# Patient Record
Sex: Male | Born: 1970 | Race: Black or African American | Hispanic: No | Marital: Single | Smoking: Never smoker
Health system: Southern US, Community
[De-identification: ages and names within clinical notes are randomized; demographics above are authoritative.]

## PROBLEM LIST (undated history)

## (undated) DIAGNOSIS — J45909 Unspecified asthma, uncomplicated: Secondary | ICD-10-CM

## (undated) DIAGNOSIS — I10 Essential (primary) hypertension: Secondary | ICD-10-CM

---

## 2013-01-31 ENCOUNTER — Emergency Department (HOSPITAL_BASED_OUTPATIENT_CLINIC_OR_DEPARTMENT_OTHER)
Admission: EM | Admit: 2013-01-31 | Discharge: 2013-01-31 | Disposition: A | Discharge status: Home / Self Care | Payer: Self-pay | Attending: Emergency Medicine | Admitting: Emergency Medicine

## 2013-01-31 ENCOUNTER — Encounter (HOSPITAL_BASED_OUTPATIENT_CLINIC_OR_DEPARTMENT_OTHER): Payer: Self-pay | Admitting: Emergency Medicine

## 2013-01-31 DIAGNOSIS — Y929 Unspecified place or not applicable: Secondary | ICD-10-CM | POA: Insufficient documentation

## 2013-01-31 DIAGNOSIS — Z880 Allergy status to penicillin: Secondary | ICD-10-CM | POA: Insufficient documentation

## 2013-01-31 DIAGNOSIS — T6391XA Toxic effect of contact with unspecified venomous animal, accidental (unintentional), initial encounter: Secondary | ICD-10-CM | POA: Insufficient documentation

## 2013-01-31 DIAGNOSIS — Y939 Activity, unspecified: Secondary | ICD-10-CM | POA: Insufficient documentation

## 2013-01-31 DIAGNOSIS — T63481A Toxic effect of venom of other arthropod, accidental (unintentional), initial encounter: Secondary | ICD-10-CM | POA: Insufficient documentation

## 2013-01-31 DIAGNOSIS — Z23 Encounter for immunization: Secondary | ICD-10-CM | POA: Insufficient documentation

## 2013-01-31 MED ORDER — TETANUS-DIPHTH-ACELL PERTUSSIS 5-2.5-18.5 LF-MCG/0.5 IM SUSP
0.5000 mL | Freq: Once | INTRAMUSCULAR | Status: AC
  Administered 2013-01-31: 0.5 mL via INTRAMUSCULAR
  Filled 2013-01-31: qty 0.5

## 2013-01-31 NOTE — ED Notes (Signed)
Insect bite to top of left foot yesterday.  Pt foot is red and swollen.

## 2013-01-31 NOTE — ED Provider Notes (Signed)
History     CSN: [id number]  Arrival date & time 01/31/13  2107   First MD Initiated Contact with Patient 01/31/13 2130      Chief Complaint  Patient presents with  . Insect Bite    (Consider location/radiation/quality/duration/timing/severity/associated sxs/prior treatment) HPI Comments: Patient comes to the ER for evaluation of pain after possible insect bite resting. Patient reports that he was stung on the top of the left foot yesterday. He has had some residual pain in the area. He has not had any tongue swelling, throat swelling or difficulty breathing. No rash.   No past medical history on file.  No past surgical history on file.  No family history on file.  History  Substance Use Topics  . Smoking status: Not on file  . Smokeless tobacco: Not on file  . Alcohol Use: Not on file      Review of Systems  HENT: Negative.   Skin: Negative for rash.  All other systems reviewed and are negative.    Allergies  Penicillins  Home Medications  No current outpatient prescriptions on file.  BP 164/100  Pulse 108  Temp(Src) 98.2 F (36.8 C) (Oral)  Resp 20  Ht 6' (1.829 m)  Wt 362 lb (164.202 kg)  BMI 49.09 kg/m2  SpO2 98%  Physical Exam  Constitutional: He is oriented to person, place, and time. He appears well-developed and well-nourished. No distress.  HENT:  Head: Normocephalic and atraumatic.  Right Ear: Hearing normal.  Left Ear: Hearing normal.  Nose: Nose normal.  Mouth/Throat: Oropharynx is clear and moist and mucous membranes are normal.  Eyes: Conjunctivae and EOM are normal. Pupils are equal, round, and reactive to light.  Neck: Normal range of motion. Neck supple.  Cardiovascular: Regular rhythm, S1 normal and S2 normal.  Exam reveals no gallop and no friction rub.   No murmur heard. Pulmonary/Chest: Effort normal and breath sounds normal. No respiratory distress. He exhibits no tenderness.  Abdominal: Soft. Normal appearance and bowel  sounds are normal. There is no hepatosplenomegaly. There is no tenderness. There is no rebound, no guarding, no tenderness at McBurney's point and negative Murphy's sign. No hernia.  Musculoskeletal: Normal range of motion.  Neurological: He is alert and oriented to person, place, and time. He has normal strength. No cranial nerve deficit or sensory deficit. Coordination normal. GCS eye subscore is 4. GCS verbal subscore is 5. GCS motor subscore is 6.  Skin: Skin is warm, dry and intact. No rash noted. No cyanosis.     Psychiatric: He has a normal mood and affect. His speech is normal and behavior is normal. Thought content normal.    ED Course  Procedures (including critical care time)  Labs Reviewed - No data to display No results found.   Diagnosis: Insect bite/sting   MDM  Patient was stung on the foot yesterday and has continued pain today. There is very minimal redness, does not appear to be infected at this time. Likely local reaction. Patient to ice, elevate and take Benadryl. Return if he develops fever increasing redness.        Gilda Crease, MD 01/31/13 [phone]

## 2013-04-29 ENCOUNTER — Encounter (HOSPITAL_BASED_OUTPATIENT_CLINIC_OR_DEPARTMENT_OTHER): Payer: Self-pay

## 2013-04-29 ENCOUNTER — Emergency Department (HOSPITAL_BASED_OUTPATIENT_CLINIC_OR_DEPARTMENT_OTHER)
Admission: EM | Admit: 2013-04-29 | Discharge: 2013-04-29 | Disposition: A | Discharge status: Home / Self Care | Payer: Self-pay | Attending: Emergency Medicine | Admitting: Emergency Medicine

## 2013-04-29 DIAGNOSIS — L0390 Cellulitis, unspecified: Secondary | ICD-10-CM

## 2013-04-29 DIAGNOSIS — Y929 Unspecified place or not applicable: Secondary | ICD-10-CM | POA: Insufficient documentation

## 2013-04-29 DIAGNOSIS — R21 Rash and other nonspecific skin eruption: Secondary | ICD-10-CM | POA: Insufficient documentation

## 2013-04-29 DIAGNOSIS — L02419 Cutaneous abscess of limb, unspecified: Secondary | ICD-10-CM | POA: Insufficient documentation

## 2013-04-29 DIAGNOSIS — L089 Local infection of the skin and subcutaneous tissue, unspecified: Secondary | ICD-10-CM | POA: Insufficient documentation

## 2013-04-29 DIAGNOSIS — Z880 Allergy status to penicillin: Secondary | ICD-10-CM | POA: Insufficient documentation

## 2013-04-29 DIAGNOSIS — W57XXXA Bitten or stung by nonvenomous insect and other nonvenomous arthropods, initial encounter: Secondary | ICD-10-CM | POA: Insufficient documentation

## 2013-04-29 DIAGNOSIS — L02519 Cutaneous abscess of unspecified hand: Secondary | ICD-10-CM | POA: Insufficient documentation

## 2013-04-29 DIAGNOSIS — Y9389 Activity, other specified: Secondary | ICD-10-CM | POA: Insufficient documentation

## 2013-04-29 DIAGNOSIS — S30860A Insect bite (nonvenomous) of lower back and pelvis, initial encounter: Secondary | ICD-10-CM | POA: Insufficient documentation

## 2013-04-29 MED ORDER — CLINDAMYCIN HCL 150 MG PO CAPS: 450.0000 mg | ORAL_CAPSULE | Freq: Three times a day (TID) | ORAL | Status: DC

## 2013-04-29 MED ORDER — DIPHENHYDRAMINE HCL 50 MG PO CAPS: 50.0000 mg | ORAL_CAPSULE | Freq: Four times a day (QID) | ORAL | Status: DC | PRN

## 2013-04-29 MED ORDER — HYDROCODONE-ACETAMINOPHEN 5-325 MG PO TABS
2.0000 | ORAL_TABLET | Freq: Once | ORAL | Status: AC
  Administered 2013-04-29: 2 via ORAL
  Filled 2013-04-29: qty 2

## 2013-04-29 MED ORDER — DIPHENHYDRAMINE HCL 25 MG PO CAPS
50.0000 mg | ORAL_CAPSULE | Freq: Once | ORAL | Status: AC
  Administered 2013-04-29: 50 mg via ORAL
  Filled 2013-04-29: qty 2

## 2013-04-29 MED ORDER — CLINDAMYCIN HCL 150 MG PO CAPS
450.0000 mg | ORAL_CAPSULE | Freq: Once | ORAL | Status: AC
  Administered 2013-04-29: 450 mg via ORAL
  Filled 2013-04-29: qty 3

## 2013-04-29 NOTE — ED Provider Notes (Signed)
CSN: [id number]     Arrival date & time 04/29/13  1442 History  This chart was scribed for Brad Hait, MD by Joaquin Music, ED Scribe. This patient was seen in room MH05/MH05 and the patient's care was started at 3:35 PM    Chief Complaint  Patient presents with  . Insect Bite   Patient is a 42 y.o. male presenting with rash. The history is provided by the patient.  Rash Location:  Hand, foot and torso Hand rash location:  L wrist Foot rash location:  L ankle Quality: itchiness and painful   Context: insect bite/sting   Relieved by:  Antihistamines Associated symptoms: no diarrhea, no fever, no nausea, no shortness of breath and not vomiting    HPI Comments: Brad Griffith is a 42 y.o. male who presents to the Emergency Department complaining of constant unchanged pain and itching to the left hand, abdomen, and ankle that began yesterday. Pt believes he was bitten by fire ants to the ankle and other insects to other areas of the body. Pt has tried Benadryl with mild relief. Pt denies nausea, emesis, diarrhea, SOB, chest pain, fever .    History reviewed. No pertinent past medical history. History reviewed. No pertinent past surgical history. No family history on file. History  Substance Use Topics  . Smoking status: Never Smoker   . Smokeless tobacco: Not on file  . Alcohol Use: No    Review of Systems  Constitutional: Negative for fever and chills.  Respiratory: Negative for shortness of breath.   Cardiovascular: Negative for chest pain and leg swelling.  Gastrointestinal: Negative for nausea, vomiting and diarrhea.  Skin: Positive for rash.  All other systems reviewed and are negative.    Allergies  Penicillins  Home Medications  No current outpatient prescriptions on file. BP 155/81  Pulse 92  Temp(Src) 98.7 F (37.1 C) (Oral)  Resp 21  Ht 6\' 1"  (1.854 m)  Wt 378 lb (171.46 kg)  BMI 49.88 kg/m2  SpO2 99%  Physical Exam  Nursing note  and vitals reviewed. Constitutional: He is oriented to person, place, and time. He appears well-developed and well-nourished. No distress.  HENT:  Head: Normocephalic and atraumatic.  Eyes: EOM are normal.  Neck: Neck supple. No tracheal deviation present.  Cardiovascular: Normal rate.   Pulmonary/Chest: Effort normal. No respiratory distress.  Musculoskeletal: Normal range of motion.       Left wrist: He exhibits swelling (mild). He exhibits normal range of motion, no tenderness and no bony tenderness.       Left hand: He exhibits swelling (mild, diffuse).  Normal cap refill and ROM of L hand/wrist/fingers. Mild swelling, no redness or erythema. No cellulitis or abscess identified.   Neurological: He is alert and oriented to person, place, and time.  Skin: Skin is warm and dry.  Has small pustules around the left ankle and the inside of the left wrist. 2 lesions on left lower abdomen with mild surrounding cellulitis.  small red blanchable areas on the  right abdomen  Psychiatric: He has a normal mood and affect. His behavior is normal.    ED Course  Procedures  DIAGNOSTIC STUDIES: Oxygen Saturation is 99% on RA, normal by my interpretation.    COORDINATION OF CARE: 3:57 PM-Discussed treatment plan which includes Benadryl with pt at bedside and pt agreed to plan.   Labs Review Labs Reviewed - No data to display Imaging Review No results found.  MDM   1. Cellulitis  2. Insect bites    89M presents with bug bites. States he thought he got bitten by fire ants. Denies fevers. Multiple pustules on L ankle, and one of L wrist. Small red blanchable lesions on torso with one on LLQ with mild surrounding cellulitis/urticaria. Patient has no systemic symptoms, denies N/V/D, denies fevers. Patient has mild L hand swelling, NVI, no concerns for compartment syndrome. Normal wrist ROM, no wrist redness, no concern for septic wrist.  Will give clindamycin, instruct to continue benadryl, and a  small amount of pain medicine. Instructed to f/u with his PCP, which eh says he wants to, in 1-2 days for a recheck. Stable for discharge.    I personally performed the serv regular LI andices described in this documentation, which was scribed in my presence. The recorded information has been reviewed and is accurate.     Brad Hait, MD 04/29/13 [phone]

## 2013-04-29 NOTE — ED Notes (Signed)
Pt reports several bug bites to left hand and left foot, abdomen.  Pruritic in nature, no redness noted.  Has taken benadryl today.

## 2013-04-29 NOTE — ED Notes (Signed)
MD at bedside. 

## 2014-05-22 ENCOUNTER — Emergency Department (HOSPITAL_BASED_OUTPATIENT_CLINIC_OR_DEPARTMENT_OTHER)
Admission: EM | Admit: 2014-05-22 | Discharge: 2014-05-22 | Disposition: A | Discharge status: Home / Self Care | Payer: Self-pay | Attending: Emergency Medicine | Admitting: Emergency Medicine

## 2014-05-22 ENCOUNTER — Emergency Department (HOSPITAL_BASED_OUTPATIENT_CLINIC_OR_DEPARTMENT_OTHER): Payer: Self-pay

## 2014-05-22 ENCOUNTER — Encounter (HOSPITAL_BASED_OUTPATIENT_CLINIC_OR_DEPARTMENT_OTHER): Payer: Self-pay | Admitting: Emergency Medicine

## 2014-05-22 DIAGNOSIS — Z880 Allergy status to penicillin: Secondary | ICD-10-CM | POA: Insufficient documentation

## 2014-05-22 DIAGNOSIS — S6710XA Crushing injury of unspecified finger(s), initial encounter: Secondary | ICD-10-CM

## 2014-05-22 DIAGNOSIS — Y9389 Activity, other specified: Secondary | ICD-10-CM | POA: Insufficient documentation

## 2014-05-22 DIAGNOSIS — Y9289 Other specified places as the place of occurrence of the external cause: Secondary | ICD-10-CM | POA: Insufficient documentation

## 2014-05-22 DIAGNOSIS — W231XXA Caught, crushed, jammed, or pinched between stationary objects, initial encounter: Secondary | ICD-10-CM | POA: Insufficient documentation

## 2014-05-22 DIAGNOSIS — S67190A Crushing injury of right index finger, initial encounter: Secondary | ICD-10-CM | POA: Insufficient documentation

## 2014-05-22 MED ORDER — OXYCODONE-ACETAMINOPHEN 5-325 MG PO TABS
1.0000 | ORAL_TABLET | Freq: Once | ORAL | Status: AC
  Administered 2014-05-22: 1 via ORAL
  Filled 2014-05-22: qty 1

## 2014-05-22 MED ORDER — OXYCODONE-ACETAMINOPHEN 5-325 MG PO TABS: 1.0000 | ORAL_TABLET | ORAL | Status: DC | PRN

## 2014-05-22 NOTE — ED Provider Notes (Signed)
CSN: [id number]     Arrival date & time 05/22/14  [id number] History   First MD Initiated Contact with Patient 05/22/14 2015     Chief Complaint  Patient presents with  . Hand Injury     (Consider location/radiation/quality/duration/timing/severity/associated sxs/prior Treatment) Patient is a [age] y.o. male presenting with hand injury. The history is provided by the patient. No language interpreter was used.  Hand Injury Location:  Finger Injury: yes   Mechanism of injury: crush   Crush injury:    Mechanism:  Motor vehicle Finger location:  R index finger Chronicity:  New Handedness:  Right-handed Dislocation: no   Foreign body present:  No foreign bodies Tetanus status:  Up to date Associated symptoms: no fever   Associated symptoms comment:  Right index finger caught in a car engine while being repaired with puncture wound caused by the piston of the engine. No other injury.    History reviewed. No pertinent past medical history. History reviewed. No pertinent past surgical history. History reviewed. No pertinent family history. History  Substance Use Topics  . Smoking status: Never Smoker   . Smokeless tobacco: Not on file  . Alcohol Use: No    Review of Systems  Constitutional: Negative for fever and chills.  Musculoskeletal:       See HPI  Skin: Positive for wound.  Neurological: Negative.  Negative for numbness.      Allergies  Penicillins  Home Medications   Prior to Admission medications   Not on File   BP 157/92  Pulse 120  Temp(Src) 99.2 F (37.3 C) (Oral)  Resp 20  SpO2 96% Physical Exam  Constitutional: He is oriented to person, place, and time. He appears well-developed and well-nourished. No distress.  Musculoskeletal:  Flap laceration to distal right index finger involving the pad of the finger with puncture wound centrally. There is a minimal subungual hematoma under intact nail. FROM all joints of finger.   Neurological: He is alert and  oriented to person, place, and time.    ED Course  Procedures (including critical care time) Labs Review Labs Reviewed - No data to display  Imaging Review Dg Finger Index Right  05/22/2014   CLINICAL DATA:  Smashed right second finger while working on car. Diffuse pain  EXAM: RIGHT SECOND FINGER 2+V  COMPARISON:  None.  FINDINGS: Frontal, oblique, and lateral views were obtained. There is no fracture or dislocation. Joint spaces appear intact. No erosive change.  IMPRESSION: No fracture or dislocation.  No appreciable arthropathic change.   Electronically Signed   By: Bretta BangWilliam  Woodruff M.D.   On: 05/22/2014 19:41     EKG Interpretation None      MDM   Final diagnoses:  None    1. Right index finger injury  Wound steri-stripped for closure. No bony injury on imaging. Minimal subungual hematoma not requiring trephination. No concern for injection injury connected with small puncture from engine part. [facility] if symptoms worsen.     Arnoldo HookerShari A Sedonia Kitner, PA-C 05/22/14 2126

## 2014-05-22 NOTE — ED Notes (Signed)
Ice back given to pt.

## 2014-05-22 NOTE — Discharge Instructions (Signed)
Crush Injury, Fingers or Toes °A crush injury to the fingers or toes means the tissues have been damaged by being squeezed (compressed). There will be bleeding into the tissues and swelling. Often, blood will collect under the skin. When this happens, the skin on the finger often dies and may slough off (shed) 1 week to 10 days later. Usually, new skin is growing underneath. If the injury has been too severe and the tissue does not survive, the damaged tissue may begin to turn black over several days.  °Wounds which occur because of the crushing may be stitched (sutured) shut. However, crush injuries are more likely to become infected than other injuries. These wounds may not be closed as tightly as other types of cuts to prevent infection. Nails involved are often lost. These usually grow back over several weeks.  °DIAGNOSIS °X-rays may be taken to see if there is any injury to the bones. °TREATMENT °Broken bones (fractures) may be treated with splinting, depending on the fracture. Often, no treatment is required for fractures of the last bone in the fingers or toes. °HOME CARE INSTRUCTIONS  °· The crushed part should be raised (elevated) above the heart or center of the chest as much as possible for the first several days or as directed. This helps with pain and lessens swelling. Less swelling increases the chances that the crushed part will survive. °· Put ice on the injured area. °¨ Put ice in a plastic bag. °¨ Place a towel between your skin and the bag. °¨ Leave the ice on for 15-20 minutes, 03-04 times a day for the first 2 days. °· Only take over-the-counter or prescription medicines for pain, discomfort, or fever as directed by your caregiver. °· Use your injured part only as directed. °· Change your bandages (dressings) as directed. °· Keep all follow-up appointments as directed by your caregiver. Not keeping your appointment could result in a chronic or permanent injury, pain, and disability. If there is  any problem keeping the appointment, you must call to reschedule. °SEEK IMMEDIATE MEDICAL CARE IF:  °· There is redness, swelling, or increasing pain in the wound area. °· Pus is coming from the wound. °· You have a fever. °· You notice a bad smell coming from the wound or dressing. °· The edges of the wound do not stay together after the sutures have been removed. °· You are unable to move the injured finger or toe. °MAKE SURE YOU:  °· Understand these instructions. °· Will watch your condition. °· Will get help right away if you are not doing well or get worse. °Document Released: 08/02/2005 Document Revised: 10/25/2011 Document Reviewed: 12/18/2010 °[employer]® Patient Information ©2015 [employer]. This information is not intended to replace advice given to you by your health care provider. Make sure you discuss any questions you have with your health care provider. ° °

## 2014-05-22 NOTE — ED Notes (Signed)
Pt smashed finger while working on a car.  Laceration noted to (R) pointer finger.  No bleeding noted.

## 2014-05-23 NOTE — ED Provider Notes (Signed)
Medical screening examination/treatment/procedure(s) were performed by non-physician practitioner and as supervising physician I was immediately available for consultation/collaboration.   EKG Interpretation None        Medard Decuir T Rupal Childress, MD 05/23/14 1544 

## 2014-12-06 ENCOUNTER — Emergency Department (HOSPITAL_BASED_OUTPATIENT_CLINIC_OR_DEPARTMENT_OTHER)
Admission: EM | Admit: 2014-12-06 | Discharge: 2014-12-06 | Disposition: A | Discharge status: Home / Self Care | Payer: Self-pay | Attending: Emergency Medicine | Admitting: Emergency Medicine

## 2014-12-06 ENCOUNTER — Emergency Department (HOSPITAL_BASED_OUTPATIENT_CLINIC_OR_DEPARTMENT_OTHER): Payer: Self-pay

## 2014-12-06 ENCOUNTER — Encounter (HOSPITAL_BASED_OUTPATIENT_CLINIC_OR_DEPARTMENT_OTHER): Payer: Self-pay | Admitting: *Deleted

## 2014-12-06 DIAGNOSIS — R609 Edema, unspecified: Secondary | ICD-10-CM

## 2014-12-06 DIAGNOSIS — E669 Obesity, unspecified: Secondary | ICD-10-CM | POA: Insufficient documentation

## 2014-12-06 DIAGNOSIS — N508 Other specified disorders of male genital organs: Secondary | ICD-10-CM | POA: Insufficient documentation

## 2014-12-06 DIAGNOSIS — I10 Essential (primary) hypertension: Secondary | ICD-10-CM | POA: Insufficient documentation

## 2014-12-06 DIAGNOSIS — Z880 Allergy status to penicillin: Secondary | ICD-10-CM | POA: Insufficient documentation

## 2014-12-06 DIAGNOSIS — N5089 Other specified disorders of the male genital organs: Secondary | ICD-10-CM

## 2014-12-06 LAB — URINALYSIS, ROUTINE W REFLEX MICROSCOPIC
BILIRUBIN URINE: NEGATIVE
Glucose, UA: NEGATIVE mg/dL
Hgb urine dipstick: NEGATIVE
KETONES UR: NEGATIVE mg/dL
Leukocytes, UA: NEGATIVE
NITRITE: NEGATIVE
PROTEIN: NEGATIVE mg/dL
Specific Gravity, Urine: 1.022 (ref 1.005–1.030)
UROBILINOGEN UA: 0.2 mg/dL (ref 0.0–1.0)
pH: 5.5 (ref 5.0–8.0)

## 2014-12-06 LAB — CBC WITH DIFFERENTIAL/PLATELET
BASOS ABS: 0 10*3/uL (ref 0.0–0.1)
BASOS PCT: 0 % (ref 0–1)
EOS PCT: 1 % (ref 0–5)
Eosinophils Absolute: 0.1 10*3/uL (ref 0.0–0.7)
HEMATOCRIT: 41.6 % (ref 39.0–52.0)
HEMOGLOBIN: 12.9 g/dL — AB (ref 13.0–17.0)
Lymphocytes Relative: 25 % (ref 12–46)
Lymphs Abs: 2.3 10*3/uL (ref 0.7–4.0)
MCH: 26.4 pg (ref 26.0–34.0)
MCHC: 31 g/dL (ref 30.0–36.0)
MCV: 85.2 fL (ref 78.0–100.0)
MONO ABS: 0.9 10*3/uL (ref 0.1–1.0)
MONOS PCT: 10 % (ref 3–12)
NEUTROS ABS: 5.8 10*3/uL (ref 1.7–7.7)
Neutrophils Relative %: 64 % (ref 43–77)
Platelets: 192 10*3/uL (ref 150–400)
RBC: 4.88 MIL/uL (ref 4.22–5.81)
RDW: 15.5 % (ref 11.5–15.5)
WBC: 9.1 10*3/uL (ref 4.0–10.5)

## 2014-12-06 LAB — BASIC METABOLIC PANEL
ANION GAP: 8 (ref 5–15)
BUN: 13 mg/dL (ref 6–23)
CO2: 31 mmol/L (ref 19–32)
CREATININE: 0.95 mg/dL (ref 0.50–1.35)
Calcium: 9.3 mg/dL (ref 8.4–10.5)
Chloride: 101 mmol/L (ref 96–112)
Glucose, Bld: 108 mg/dL — ABNORMAL HIGH (ref 70–99)
Potassium: 3.6 mmol/L (ref 3.5–5.1)
Sodium: 140 mmol/L (ref 135–145)

## 2014-12-06 MED ORDER — LISINOPRIL 5 MG PO TABS: 5.0000 mg | ORAL_TABLET | Freq: Every day | ORAL | Status: DC

## 2014-12-06 MED ORDER — HYDROCODONE-ACETAMINOPHEN 5-325 MG PO TABS: 1.0000 | ORAL_TABLET | ORAL | Status: DC | PRN

## 2014-12-06 NOTE — ED Provider Notes (Signed)
CSN: [id number]     Arrival date & time 12/06/14  1151 History   First MD Initiated Contact with Patient 12/06/14 1303     Chief Complaint  Patient presents with  . Testicle Pain     (Consider location/radiation/quality/duration/timing/severity/associated sxs/prior Treatment) HPI Comments: 44 yo male presenting with a 3 week history of scrotal edema and pain.  States [facility] and [facility], then referred to nephrology.  Pt tells me testicular ultrasound was normal, labs were normal and he "checked out ok".  He today because pain and swelling are not improving.  Pain is worse with ambulating.  Has not taken medication for relief, however states he elevates testicles frequently.  Voiding normally. Denies fevers, abdominal pain.  Denies SOB or CP.   Patient is a 44 y.o. male presenting with testicular pain.  Testicle Pain    History reviewed. No pertinent past medical history. History reviewed. No pertinent past surgical history. No family history on file. History  Substance Use Topics  . Smoking status: Never Smoker   . Smokeless tobacco: Not on file  . Alcohol Use: No    Review of Systems  Genitourinary: Positive for testicular pain.  All other systems reviewed and are negative.     Allergies  Penicillins  Home Medications   Prior to Admission medications   Medication Sig Start Date End Date Taking? Authorizing Provider  oxyCODONE-acetaminophen (PERCOCET/ROXICET) 5-325 MG per tablet Take 1 tablet by mouth every 4 (four) hours as needed for severe pain. 05/22/14   Shari Upstill, PA-C   BP 189/104 mmHg  Pulse 117  Temp(Src) 98 F (36.7 C) (Oral)  Resp 20  Ht 6' (1.829 m)  Wt 390 lb (176.903 kg)  BMI 52.88 kg/m2  SpO2 95% Physical Exam  Constitutional: He appears well-developed.  Non-toxic appearance.  Obese  HENT:  Head: Normocephalic.  Eyes: EOM are normal.  Cardiovascular: Normal rate, regular  rhythm and normal heart sounds.   No murmur heard. Pulmonary/Chest: Breath sounds normal.  Diminished throughout  Abdominal: Soft. There is no tenderness.  Genitourinary: Right testis shows swelling. Left testis shows swelling. No penile erythema.  Severe scrotal and penile swelling.  Painful to touch.  No erythema.  Musculoskeletal: Normal range of motion. He exhibits edema.  Bilateral 3+ pitting in LE. No erythema or warmth.  Neurological: He is alert.  Skin: Skin is warm, dry and intact.  Psychiatric: He has a normal mood and affect.  Nursing note and vitals reviewed.   ED Course  Procedures (including critical care time) Labs Review Labs Reviewed  CBC WITH DIFFERENTIAL/PLATELET - Abnormal; Notable for the following:    Hemoglobin 12.9 (*)    All other components within normal limits  BASIC METABOLIC PANEL - Abnormal; Notable for the following:    Glucose, Bld 108 (*)    All other components within normal limits  URINALYSIS, ROUTINE W REFLEX MICROSCOPIC    Imaging Review Dg Chest 2 View  12/06/2014   CLINICAL DATA:  Scrotal swelling  EXAM: CHEST  2 VIEW  COMPARISON:  None.  FINDINGS: The heart size and mediastinal contours are within normal limits. Both lungs are clear. The visualized skeletal structures are unremarkable.  IMPRESSION: No active cardiopulmonary disease.   Electronically Signed   By: Marlan Palauharles  Clark M.D.   On: 12/06/2014 15:25     EKG Interpretation None      MDM   Final diagnoses:  Swelling  Testicular  swelling  Peripheral edema  Essential hypertension   Records from [facility] ED and [facility] reviewed. [facility] contacted regarding appt. Pt instructed to FU with nephrology for follow up.  HTN, will start Lisinopril.  Labs unremarkable and renal function stable at this time. CXR normal and no evidence of cardiopulmonary disease.  Hydrocodone for pain.  Return precautions discussed.    Teressa Lower, NP 12/06/14 1545  Vanetta Mulders, MD 12/09/14 [phone]

## 2014-12-06 NOTE — Discharge Instructions (Signed)
Edema Edema is an abnormal buildup of fluids. It is more common in your legs and thighs. Painless swelling of the feet and ankles is more likely as a person ages. It also is common in looser skin, like around your eyes. HOME CARE   Keep the affected body part above the level of the heart while lying down.  Do not sit still or stand for a long time.  Do not put anything right under your knees when you lie down.  Do not wear tight clothes on your upper legs.  Exercise your legs to help the puffiness (swelling) go down.  Wear elastic bandages or support stockings as told by your doctor.  A low-salt diet may help lessen the puffiness.  Only take medicine as told by your doctor. GET HELP IF:  Treatment is not working.  You have heart, liver, or kidney disease and notice that your skin looks puffy or shiny.  You have puffiness in your legs that does not get better when you raise your legs.  You have sudden weight gain for no reason. GET HELP RIGHT AWAY IF:   You have shortness of breath or chest pain.  You cannot breathe when you lie down.  You have pain, redness, or warmth in the areas that are puffy.  You have heart, liver, or kidney disease and get edema all of a sudden.  You have a fever and your symptoms get worse all of a sudden. MAKE SURE YOU:   Understand these instructions.  Will watch your condition.  Will get help right away if you are not doing well or get worse. Document Released: 01/19/2008 Document Revised: 08/07/2013 Document Reviewed: 05/25/2013 [facility] Patient Information 2015 [place], [place]. This information is not intended to replace advice given to you by your health care provider. Make sure you discuss any questions you have with your health care provider.  Hypertension Hypertension, commonly called high blood pressure, is when the force of blood pumping through your arteries is too strong. Your arteries are the blood vessels that carry blood from  your heart throughout your body. A blood pressure reading consists of a higher number over a lower number, such as 110/72. The higher number (systolic) is the pressure inside your arteries when your heart pumps. The lower number (diastolic) is the pressure inside your arteries when your heart relaxes. Ideally you want your blood pressure below 120/80. Hypertension forces your heart to work harder to pump blood. Your arteries may become narrow or stiff. Having hypertension puts you at risk for heart disease, stroke, and other problems.  RISK FACTORS Some risk factors for high blood pressure are controllable. Others are not.  Risk factors you cannot control include:   Race. You may be at higher risk if you are African American.  Age. Risk increases with age.  Gender. Men are at higher risk than women before age 67 years. After age 78, women are at higher risk than men. Risk factors you can control include:  Not getting enough exercise or physical activity.  Being overweight.  Getting too much fat, sugar, calories, or salt in your diet.  Drinking too much alcohol. SIGNS AND SYMPTOMS Hypertension does not usually cause signs or symptoms. Extremely high blood pressure (hypertensive crisis) may cause headache, anxiety, shortness of breath, and nosebleed. DIAGNOSIS  To check if you have hypertension, your health care provider will measure your blood pressure while you are seated, with your arm held at the level of your heart. It should  be measured at least twice using the same arm. Certain conditions can cause a difference in blood pressure between your right and left arms. A blood pressure reading that is higher than normal on one occasion does not mean that you need treatment. If one blood pressure reading is high, ask your health care provider about having it checked again. TREATMENT  Treating high blood pressure includes making lifestyle changes and possibly taking medicine. Living a healthy  lifestyle can help lower high blood pressure. You may need to change some of your habits. Lifestyle changes may include:  Following the DASH diet. This diet is high in fruits, vegetables, and whole grains. It is low in salt, red meat, and added sugars.  Getting at least 2 hours of brisk physical activity every week.  Losing weight if necessary.  Not smoking.  Limiting alcoholic beverages.  Learning ways to reduce stress. If lifestyle changes are not enough to get your blood pressure under control, your health care provider may prescribe medicine. You may need to take more than one. Work closely with your health care provider to understand the risks and benefits. HOME CARE INSTRUCTIONS  Have your blood pressure rechecked as directed by your health care provider.   Take medicines only as directed by your health care provider. Follow the directions carefully. Blood pressure medicines must be taken as prescribed. The medicine does not work as well when you skip doses. Skipping doses also puts you at risk for problems.   Do not smoke.   Monitor your blood pressure at home as directed by your health care provider. SEEK MEDICAL CARE IF:   You think you are having a reaction to medicines taken.  You have recurrent headaches or feel dizzy.  You have swelling in your ankles.  You have trouble with your vision. SEEK IMMEDIATE MEDICAL CARE IF:  You develop a severe headache or confusion.  You have unusual weakness, numbness, or feel faint.  You have severe chest or abdominal pain.  You vomit repeatedly.  You have trouble breathing. MAKE SURE YOU:   Understand these instructions.  Will watch your condition.  Will get help right away if you are not doing well or get worse. Document Released: 08/02/2005 Document Revised: 12/17/2013 Document Reviewed: 05/25/2013 [facility] Patient Information 2015 [place], [place]. This information is not intended to replace advice given to you  by your health care provider. Make sure you discuss any questions you have with your health care provider.  Peripheral Edema You have swelling in your legs (peripheral edema). This swelling is due to excess accumulation of salt and water in your body. Edema may be a sign of heart, kidney or liver disease, or a side effect of a medication. It may also be due to problems in the leg veins. Elevating your legs and using special support stockings may be very helpful, if the cause of the swelling is due to poor venous circulation. Avoid long periods of standing, whatever the cause. Treatment of edema depends on identifying the cause. Chips, pretzels, pickles and other salty foods should be avoided. Restricting salt in your diet is almost always needed. Water pills (diuretics) are often used to remove the excess salt and water from your body via urine. These medicines prevent the kidney from reabsorbing sodium. This increases urine flow. Diuretic treatment may also result in lowering of potassium levels in your body. Potassium supplements may be needed if you have to use diuretics daily. Daily weights can help you keep track of  your progress in clearing your edema. You should call your caregiver for follow up care as recommended. SEEK IMMEDIATE MEDICAL CARE IF:   You have increased swelling, pain, redness, or heat in your legs.  You develop shortness of breath, especially when lying down.  You develop chest or abdominal pain, weakness, or fainting.  You have a fever. Document Released: 09/09/2004 Document Revised: 10/25/2011 Document Reviewed: 08/20/2009 [facility] Patient Information 2015 [place], [place]. This information is not intended to replace advice given to you by your health care provider. Make sure you discuss any questions you have with your health care provider.  Scrotal Swelling Scrotal swelling may occur on one or both sides of the scrotum. Pain may also occur with swelling. Possible causes  of scrotal swelling include:   Injury.  Infection.  An ingrown hair or abrasion in the area.  Repeated rubbing from tight-fitting underwear.  Poor hygiene.  A weakened area in the muscles around the groin (hernia). A hernia can allow abdominal contents to push into the scrotum.  Fluid around the testicle (hydrocele).  Enlarged vein around the testicle (varicocele).  Certain medical treatments or existing conditions.  A recent genital surgery or procedure.  The spermatic cord becomes twisted in the scrotum, which cuts off blood supply (testicular torsion).  Testicular cancer. HOME CARE INSTRUCTIONS Once the cause of your scrotal swelling has been determined, you may be asked to monitor your scrotum for any changes. The following actions may help to alleviate any discomfort you are experiencing:  Rest and limit activity until the swelling goes away. Lying down is the preferred position.  Put ice on the scrotum:  Put ice in a plastic bag.  Place a towel between your skin and the bag.  Leave the ice on for 20 minutes, 2-3 times a day for 1-2 days.  Place a rolled towel under the testicles for support.  Wear loose-fitting clothing or an athletic support cup for comfort.  Take all medicines as directed by your health care provider.  Perform a monthly self-exam of the scrotum and penis. Feel for changes. Ask your health care provider how to perform a monthly self-exam if you are unsure. SEEK MEDICAL CARE IF:  You have a sudden (acute) onset of pain that is persistent and not improving.  You notice a heavy feeling or fluid in the scrotum.  You have pain or burning while urinating.  You have blood in the urine or semen.  You feel a lump around the testicle.  You notice that one testicle is larger than the other (slight variation is normal).  You have a persistent dull ache or pain in the groin or scrotum. SEEK IMMEDIATE MEDICAL CARE IF:  The pain does not go away  or becomes severe.  You have a fever or shaking chills.  You have pain or vomiting that cannot be controlled.  You notice significant redness or swelling of one or both sides of the scrotum.  You experience redness spreading upward from your scrotum to your abdomen or downward from your scrotum to your thighs. MAKE SURE YOU:  Understand these instructions.  Will watch your condition.  Will get help right away if you are not doing well or get worse. Document Released: 09/04/2010 Document Revised: 04/04/2013 Document Reviewed: 01/04/2013 [facility] Patient Information 2015 [place], [place]. This information is not intended to replace advice given to you by your health care provider. Make sure you discuss any questions you have with your health care provider.

## 2014-12-06 NOTE — ED Notes (Signed)
Swelling and pain in his scrotum. He was seen by a Urologist on Monday and they referred him to another MD unknown to the pt. They have been waiting for a call back from the MD to set up an appointment time. Here today to get pain relief and possible drainage of scrotal swelling.

## 2014-12-06 NOTE — ED Notes (Signed)
Patient transported to X-ray 

## 2017-04-11 ENCOUNTER — Encounter (HOSPITAL_BASED_OUTPATIENT_CLINIC_OR_DEPARTMENT_OTHER): Payer: Self-pay | Admitting: Emergency Medicine

## 2017-04-11 ENCOUNTER — Emergency Department (HOSPITAL_BASED_OUTPATIENT_CLINIC_OR_DEPARTMENT_OTHER)
Admission: EM | Admit: 2017-04-11 | Discharge: 2017-04-11 | Disposition: A | Discharge status: Home / Self Care | Payer: Self-pay | Attending: Emergency Medicine | Admitting: Emergency Medicine

## 2017-04-11 DIAGNOSIS — IMO0001 Reserved for inherently not codable concepts without codable children: Secondary | ICD-10-CM

## 2017-04-11 DIAGNOSIS — T6291XA Toxic effect of unspecified noxious substance eaten as food, accidental (unintentional), initial encounter: Secondary | ICD-10-CM | POA: Insufficient documentation

## 2017-04-11 DIAGNOSIS — Z79899 Other long term (current) drug therapy: Secondary | ICD-10-CM | POA: Insufficient documentation

## 2017-04-11 MED ORDER — ONDANSETRON HCL 4 MG PO TABS
4.0000 mg | ORAL_TABLET | Freq: Four times a day (QID) | ORAL | 0 refills | Status: DC
Start: 2017-04-11 — End: 2018-05-24

## 2017-04-11 MED ORDER — ONDANSETRON 8 MG PO TBDP
8.0000 mg | ORAL_TABLET | Freq: Once | ORAL | Status: AC
  Administered 2017-04-11: 8 mg via ORAL
  Filled 2017-04-11: qty 1

## 2017-04-11 NOTE — ED Triage Notes (Signed)
Pt reports gl abd pain, vomited twice after eating some green beans and ribs. sts pain is better today. Denies diarrhea. No NV today. Alert and oriented x 4. denies Hx acid reflux.

## 2017-04-11 NOTE — ED Provider Notes (Signed)
[facility]   CSN: [id number] Arrival date & time: 04/11/17  5784     History   Chief Complaint Chief Complaint  Patient presents with  . Abdominal Pain    HPI Brad Griffith is a 46 y.o. male.  The history is provided by the patient. No language interpreter was used.  Abdominal Pain     Brad Griffith is a 46 y.o. male who presents to the Emergency Department complaining of Vomiting. Last night he ate green beans and ribs and about 40 minutes later developed multiple episodes of emesis and diarrhea. Overall his vomiting and diarrhea has resolved but he does have mild associated nausea. He had abdominal discomfort following vomiting but this is improving. No fevers, chest pain, shortness of breath, urinary difficulties. He has no medical problems. He states he is here today because he needs a note for work. History reviewed. No pertinent past medical history.  There are no active problems to display for this patient.   History reviewed. No pertinent surgical history.     Home Medications    Prior to Admission medications   Medication Sig Start Date End Date Taking? Authorizing Provider  HYDROcodone-acetaminophen (NORCO/VICODIN) 5-325 MG per tablet Take 1-2 tablets by mouth every 4 (four) hours as needed. 12/06/14   Teressa Lower, NP  lisinopril (PRINIVIL,ZESTRIL) 5 MG tablet Take 1 tablet (5 mg total) by mouth daily. 12/06/14   Teressa Lower, NP  ondansetron (ZOFRAN) 4 MG tablet Take 1 tablet (4 mg total) by mouth every 6 (six) hours. 04/11/17   Tilden Fossa, MD  oxyCODONE-acetaminophen (PERCOCET/ROXICET) 5-325 MG per tablet Take 1 tablet by mouth every 4 (four) hours as needed for severe pain. 05/22/14   Elpidio Anis, PA-C    Family History No family history on file.  Social History Social History  Substance Use Topics  . Smoking status: Never Smoker  . Smokeless tobacco: Not on file  . Alcohol use No     Allergies     Penicillins   Review of Systems Review of Systems  Gastrointestinal: Positive for abdominal pain.  All other systems reviewed and are negative.    Physical Exam Updated Vital Signs BP (!) 163/106 (BP Location: Left Arm)   Pulse (!) 102   Temp 98.7 F (37.1 C) (Oral)   Resp 20   Ht 6\' 1"  (1.854 m)   Wt (!) 176.9 kg (390 lb)   SpO2 98%   BMI 51.45 kg/m   Physical Exam  Constitutional: He is oriented to person, place, and time. He appears well-developed and well-nourished.  Obese  HENT:  Head: Normocephalic and atraumatic.  Cardiovascular: Regular rhythm.   No murmur heard. Tachycardic  Pulmonary/Chest: Effort normal and breath sounds normal. No respiratory distress.  Abdominal: Soft. There is no tenderness. There is no rebound and no guarding.  Musculoskeletal: He exhibits no tenderness.  Nonpitting edema to bilateral lower extremities  Neurological: He is alert and oriented to person, place, and time.  Skin: Skin is warm and dry.  Psychiatric: He has a normal mood and affect. His behavior is normal.  Nursing note and vitals reviewed.    ED Treatments / Results  Labs (all labs ordered are listed, but only abnormal results are displayed) Labs Reviewed - No data to display  EKG  EKG Interpretation None       Radiology No results found.  Procedures Procedures (including critical care time)  Medications Ordered in ED Medications  ondansetron (ZOFRAN-ODT) disintegrating tablet 8  mg (not administered)     Initial Impression / Assessment and Plan / ED Course  I have reviewed the triage vital signs and the nursing notes.  Pertinent labs & imaging results that were available during my care of the patient were reviewed by me and considered in my medical decision making (see chart for details).     Patient here for work note falling episode of vomiting and diarrhea last night with some epigastric discomfort. He has no abdominal tenderness on examination.  He does have mild tachycardia but he says he has a lot of anxiety when around health care providers. Discussed recommendation for checking labs to evaluate his renal function and electrolytes. Patient refuses lab draw at this time. He would like a work note because he is missing work. Discussed important PCP follow-up as well as close return precautions if he develops any recurrent abdominal pain, ongoing nausea/vomiting.  Current clinical picture is not consistent with acute abdomen.   Final Clinical Impressions(s) / ED Diagnoses   Final diagnoses:  Food poisoning, accidental or unintentional, initial encounter    New Prescriptions New Prescriptions   ONDANSETRON (ZOFRAN) 4 MG TABLET    Take 1 tablet (4 mg total) by mouth every 6 (six) hours.     Tilden Fossa, MD 04/11/17 [phone]

## 2017-04-11 NOTE — ED Notes (Signed)
Pt directed to pharmacy to pick up RX. Note for work given 

## 2018-03-05 ENCOUNTER — Emergency Department (HOSPITAL_BASED_OUTPATIENT_CLINIC_OR_DEPARTMENT_OTHER)
Admission: EM | Admit: 2018-03-05 | Discharge: 2018-03-05 | Discharge status: Against Medical Advice | Payer: Self-pay | Attending: Emergency Medicine | Admitting: Emergency Medicine

## 2018-03-05 ENCOUNTER — Encounter (HOSPITAL_BASED_OUTPATIENT_CLINIC_OR_DEPARTMENT_OTHER): Payer: Self-pay | Admitting: Emergency Medicine

## 2018-03-05 ENCOUNTER — Other Ambulatory Visit: Payer: Self-pay

## 2018-03-05 ENCOUNTER — Emergency Department (HOSPITAL_BASED_OUTPATIENT_CLINIC_OR_DEPARTMENT_OTHER): Payer: Self-pay

## 2018-03-05 DIAGNOSIS — J45909 Unspecified asthma, uncomplicated: Secondary | ICD-10-CM | POA: Insufficient documentation

## 2018-03-05 DIAGNOSIS — R600 Localized edema: Secondary | ICD-10-CM | POA: Insufficient documentation

## 2018-03-05 DIAGNOSIS — R079 Chest pain, unspecified: Secondary | ICD-10-CM | POA: Insufficient documentation

## 2018-03-05 DIAGNOSIS — R000 Tachycardia, unspecified: Secondary | ICD-10-CM | POA: Insufficient documentation

## 2018-03-05 DIAGNOSIS — Z79899 Other long term (current) drug therapy: Secondary | ICD-10-CM | POA: Insufficient documentation

## 2018-03-05 HISTORY — DX: Unspecified asthma, uncomplicated: J45.909

## 2018-03-05 LAB — BASIC METABOLIC PANEL
ANION GAP: 10 (ref 5–15)
BUN: 13 mg/dL (ref 6–20)
CHLORIDE: 103 mmol/L (ref 98–111)
CO2: 28 mmol/L (ref 22–32)
Calcium: 9.4 mg/dL (ref 8.9–10.3)
Creatinine, Ser: 1.04 mg/dL (ref 0.61–1.24)
GFR calc non Af Amer: >60 mL/min (ref >60)
GLUCOSE: 107 mg/dL — AB (ref 70–99)
Potassium: 4 mmol/L (ref 3.5–5.1)
Sodium: 141 mmol/L (ref 135–145)

## 2018-03-05 LAB — CBC
HCT: 41.9 % (ref 39.0–52.0)
HEMOGLOBIN: 13.3 g/dL (ref 13.0–17.0)
MCH: 26.9 pg (ref 26.0–34.0)
MCHC: 31.7 g/dL (ref 30.0–36.0)
MCV: 84.8 fL (ref 78.0–100.0)
Platelets: 210 10*3/uL (ref 150–400)
RBC: 4.94 MIL/uL (ref 4.22–5.81)
RDW: 15.4 % (ref 11.5–15.5)
WBC: 10 10*3/uL (ref 4.0–10.5)

## 2018-03-05 LAB — D-DIMER, QUANTITATIVE: D-Dimer, Quant: <0.27 ug/mL-FEU (ref 0.00–0.50)

## 2018-03-05 LAB — TROPONIN I: Troponin I: <0.03 ng/mL (ref <0.03)

## 2018-03-05 NOTE — ED Triage Notes (Signed)
Central chest pain x 1 hour. Denies N/V, SOB

## 2018-03-05 NOTE — ED Provider Notes (Signed)
[facility] EMERGENCY DEPARTMENT Provider Note   CSN: [id number] Arrival date & time: 03/05/18  1150     History   Chief Complaint Chief Complaint  Patient presents with  . Chest Pain    HPI Brad Griffith is a 47 y.o. male.  47 year old male with past medical history including asthma and morbid obesity who presents with chest pain.  Just prior to arrival, the patient was at rest when he had a sudden onset of central, nonradiating chest pain which he describes as feeling like something was stuck in the middle of his chest.  The pain has already almost completely resolved while sitting in the ED.  He denies any associated shortness of breath, nausea, vomiting, or diaphoresis.  No fever, cough/cold symptoms, or recent illness.  He has occasional lower extremity swelling but denies any significant swelling currently.  No recent travel, history of blood clots, or history of cancer.  He denies any family history of heart disease.  He denies any alcohol, tobacco, or drug use.  The history is provided by the patient.  Chest Pain      Past Medical History:  Diagnosis Date  . Asthma     There are no active problems to display for this patient.   History reviewed. No pertinent surgical history.      Home Medications    Prior to Admission medications   Medication Sig Start Date End Date Taking? Authorizing Provider  HYDROcodone-acetaminophen (NORCO/VICODIN) 5-325 MG per tablet Take 1-2 tablets by mouth every 4 (four) hours as needed. 12/06/14   Teressa LowerPickering, Vrinda, NP  lisinopril (PRINIVIL,ZESTRIL) 5 MG tablet Take 1 tablet (5 mg total) by mouth daily. 12/06/14   Teressa LowerPickering, Vrinda, NP  ondansetron (ZOFRAN) 4 MG tablet Take 1 tablet (4 mg total) by mouth every 6 (six) hours. 04/11/17   Tilden Fossaees, Elizabeth, MD  oxyCODONE-acetaminophen (PERCOCET/ROXICET) 5-325 MG per tablet Take 1 tablet by mouth every 4 (four) hours as needed for severe pain. 05/22/14   Elpidio AnisUpstill, Shari, PA-C     Family History No family history on file.  Social History Social History   Tobacco Use  . Smoking status: Never Smoker  . Smokeless tobacco: Never Used  Substance Use Topics  . Alcohol use: No  . Drug use: No     Allergies   Penicillins   Review of Systems Review of Systems  Cardiovascular: Positive for chest pain.   All other systems reviewed and are negative except that which was mentioned in HPI   Physical Exam Updated Vital Signs BP (!) 162/102 (BP Location: Right Arm)   Pulse (!) 101   Resp 18   Ht 6' (1.829 m)   Wt (!) 208.7 kg (460 lb)   SpO2 96%   BMI 62.39 kg/m   Physical Exam  Constitutional: He is oriented to person, place, and time. He appears well-developed and well-nourished. No distress.  HENT:  Head: Normocephalic and atraumatic.  Moist mucous membranes  Eyes: Conjunctivae are normal.  Neck: Neck supple.  Cardiovascular: Regular rhythm and normal heart sounds. Tachycardia present.  No murmur heard. Pulmonary/Chest: Effort normal and breath sounds normal.  Abdominal: Soft. Bowel sounds are normal. He exhibits no distension. There is no tenderness.  Musculoskeletal:       Right lower leg: He exhibits edema.       Left lower leg: He exhibits edema.  Trace BLE edema  Neurological: He is alert and oriented to person, place, and time.  Fluent speech  Skin: Skin  is warm and dry.  Psychiatric: Judgment normal. His mood appears anxious.  Nursing note and vitals reviewed.    ED Treatments / Results  Labs (all labs ordered are listed, but only abnormal results are displayed) Labs Reviewed  BASIC METABOLIC PANEL - Abnormal; Notable for the following components:      Result Value   Glucose, Bld 107 (*)    All other components within normal limits  CBC  TROPONIN I  D-DIMER, QUANTITATIVE (NOT AT [facility])  TROPONIN I    EKG None  Radiology Dg Chest 2 View  Result Date: 03/05/2018 CLINICAL DATA:  Chest pressure for 1 hour EXAM: CHEST -  2 VIEW COMPARISON:  None. FINDINGS: Normal mediastinum and cardiac silhouette. Normal pulmonary vasculature. No evidence of effusion, infiltrate, or pneumothorax. No acute bony abnormality. IMPRESSION: No acute cardiopulmonary process. Electronically Signed   By: Genevive Bi M.D.   On: 03/05/2018 12:44    Procedures Procedures (including critical care time)  Medications Ordered in ED Medications - No data to display   Initial Impression / Assessment and Plan / ED Course  I have reviewed the triage vital signs and the nursing notes.  Pertinent labs & imaging results that were available during my care of the patient were reviewed by me and considered in my medical decision making (see chart for details).    PT was tachycardic on exam, mildly hypertensive.  He has lisinopril listed on previous chart documentation but denies history of hypertension.  He was very anxious during exam.  EKG without acute ischemic changes.  Chest x-ray negative acute.  Initial troponin was negative.  Basic lab work reassuring.  Sent d-dimer due to tachycardia which was negative, making PE very unlikely.  I recommended a second troponin because of the patient's hypertension and morbid obesity.  He requested to leave.  He said "I will just go to another hospital." I asked if he was unhappy with any aspect of his care or felt that he needed anything further here and he said "no, yall have done a great job." It was unclear why he desired to leave. I discussed risks of leaving including worsening condition, permanent disability, or even death.  He voiced understanding of risks.  Patient signed out AGAINST MEDICAL ADVICE.  Final Clinical Impressions(s) / ED Diagnoses   Final diagnoses:  Chest pain, unspecified type    ED Discharge Orders    None       Little, Ambrose [place], MD 03/05/18 1546

## 2018-03-05 NOTE — ED Notes (Addendum)
Pt demanded to have his PIV removed and stated that the wait is too long. Pt informed of when the next tropin is ordered to drawn. This RN complied w/ pts request. Little, MD informed and is speaking to pt at bedside.

## 2018-03-05 NOTE — Discharge Instructions (Addendum)
PLEASE RETURN TO THE ER OR TO YOUR PRIMARY CARE PROVIDER FOR COMPLETION OF YOUR CHEST PAIN WORK UP.

## 2018-04-17 ENCOUNTER — Other Ambulatory Visit: Payer: Self-pay

## 2018-04-17 ENCOUNTER — Emergency Department (HOSPITAL_BASED_OUTPATIENT_CLINIC_OR_DEPARTMENT_OTHER)
Admission: EM | Admit: 2018-04-17 | Discharge: 2018-04-17 | Disposition: A | Discharge status: Home / Self Care | Payer: Worker's Compensation | Attending: Emergency Medicine | Admitting: Emergency Medicine

## 2018-04-17 ENCOUNTER — Encounter (HOSPITAL_BASED_OUTPATIENT_CLINIC_OR_DEPARTMENT_OTHER): Payer: Self-pay | Admitting: *Deleted

## 2018-04-17 DIAGNOSIS — Y93G1 Activity, food preparation and clean up: Secondary | ICD-10-CM | POA: Diagnosis not present

## 2018-04-17 DIAGNOSIS — Y929 Unspecified place or not applicable: Secondary | ICD-10-CM | POA: Insufficient documentation

## 2018-04-17 DIAGNOSIS — T23131A Burn of first degree of multiple right fingers (nail), not including thumb, initial encounter: Secondary | ICD-10-CM | POA: Insufficient documentation

## 2018-04-17 DIAGNOSIS — T23121A Burn of first degree of single right finger (nail) except thumb, initial encounter: Secondary | ICD-10-CM

## 2018-04-17 DIAGNOSIS — Z79899 Other long term (current) drug therapy: Secondary | ICD-10-CM | POA: Diagnosis not present

## 2018-04-17 DIAGNOSIS — X19XXXA Contact with other heat and hot substances, initial encounter: Secondary | ICD-10-CM | POA: Diagnosis not present

## 2018-04-17 DIAGNOSIS — J45909 Unspecified asthma, uncomplicated: Secondary | ICD-10-CM | POA: Diagnosis not present

## 2018-04-17 DIAGNOSIS — Y998 Other external cause status: Secondary | ICD-10-CM | POA: Insufficient documentation

## 2018-04-17 DIAGNOSIS — T23031A Burn of unspecified degree of multiple right fingers (nail), not including thumb, initial encounter: Secondary | ICD-10-CM | POA: Diagnosis present

## 2018-04-17 MED ORDER — IBUPROFEN 800 MG PO TABS: 800.0000 mg | ORAL_TABLET | Freq: Three times a day (TID) | ORAL | 0 refills | Status: DC

## 2018-04-17 NOTE — ED Provider Notes (Signed)
[facility] EMERGENCY DEPARTMENT Provider Note   CSN: [id number] Arrival date & time: 04/17/18  1518     History   Chief Complaint Chief Complaint  Patient presents with  . Hand Burn    HPI Brad Griffith is a 47 y.o. male presented for evaluation of burn of the right fingers.  Patient states that around 2:00 this afternoon, he was cleaning dishes when he went to pick up a hot plate.  He reports acute onset of pain of the distal aspect of his index through ring finger of his right hand.  He denies blistering or open laceration.  He denies injury to other fingers or elsewhere on his body.  He immediately ran his fingers under cold water, has not done anything else for pain.  He has not taken any ibuprofen or used any ice.  He has a history of asthma, no other medical problems.  He does not take medications daily.  He denies circumferential burn or significant swelling.  HPI  Past Medical History:  Diagnosis Date  . Asthma     There are no active problems to display for this patient.   History reviewed. No pertinent surgical history.      Home Medications    Prior to Admission medications   Medication Sig Start Date End Date Taking? Authorizing Provider  HYDROcodone-acetaminophen (NORCO/VICODIN) 5-325 MG per tablet Take 1-2 tablets by mouth every 4 (four) hours as needed. 12/06/14   Teressa Lower, NP  ibuprofen (ADVIL,MOTRIN) 800 MG tablet Take 1 tablet (800 mg total) by mouth 3 (three) times daily with meals. 04/17/18   Belladonna Lubinski, PA-C  lisinopril (PRINIVIL,ZESTRIL) 5 MG tablet Take 1 tablet (5 mg total) by mouth daily. 12/06/14   Teressa Lower, NP  ondansetron (ZOFRAN) 4 MG tablet Take 1 tablet (4 mg total) by mouth every 6 (six) hours. 04/11/17   Tilden Fossa, MD  oxyCODONE-acetaminophen (PERCOCET/ROXICET) 5-325 MG per tablet Take 1 tablet by mouth every 4 (four) hours as needed for severe pain. 05/22/14   Elpidio Anis, PA-C    Family  History History reviewed. No pertinent family history.  Social History Social History   Tobacco Use  . Smoking status: Never Smoker  . Smokeless tobacco: Never Used  Substance Use Topics  . Alcohol use: No  . Drug use: No     Allergies   Penicillins   Review of Systems Review of Systems  Skin:       Tenderness of R distal fingers  Allergic/Immunologic: Negative for immunocompromised state.  Hematological: Does not bruise/bleed easily.     Physical Exam Updated Vital Signs BP (!) 168/104 (BP Location: Right Arm)   Pulse 99   Temp 99.4 F (37.4 C) (Oral)   Resp 20   Ht 6' (1.829 m)   Wt (!) 199.6 kg   SpO2 99%   BMI 59.67 kg/m   Physical Exam  Constitutional: He is oriented to person, place, and time. He appears well-developed and well-nourished. No distress.  HENT:  Head: Normocephalic and atraumatic.  Eyes: EOM are normal.  Neck: Normal range of motion.  Pulmonary/Chest: Effort normal.  Abdominal: He exhibits no distension.  Musculoskeletal: Normal range of motion. He exhibits tenderness.  Neurological: He is alert and oriented to person, place, and time.  Skin: Skin is warm. Capillary refill takes less than 2 seconds. No rash noted. No erythema.  Minimal swelling noted of the distal aspect of the index and ring fingers.  No blistering.  No open  wounds.  No inferential swelling.  No significant erythema.  Sensation intact.  Full active range of motion of all fingers intact.  Radial pulses intact.  Grip strength intact.  Psychiatric: He has a normal mood and affect.  Nursing note and vitals reviewed.    ED Treatments / Results  Labs (all labs ordered are listed, but only abnormal results are displayed) Labs Reviewed - No data to display  EKG None  Radiology No results found.  Procedures Procedures (including critical care time)  Medications Ordered in ED Medications - No data to display   Initial Impression / Assessment and Plan / ED Course   I have reviewed the triage vital signs and the nursing notes.  Pertinent labs & imaging results that were available during my care of the patient were reviewed by me and considered in my medical decision making (see chart for details).     Presenting for evaluation of superficial burn of his index and ring finger of his right hand.  Exam reassuring, neurovascularly intact.  No circumferential swelling or significant erythema.  No blistering or open wounds.  Discussed burn care with patient including NSAIDs, ice, and aloe.  Discussed monitoring for signs of infection.  At this time, patient appears safe for discharge.  Return precautions given.  Patient states he understands and agrees plan.  Final Clinical Impressions(s) / ED Diagnoses   Final diagnoses:  Burn of finger, first degree, right, initial encounter    ED Discharge Orders         Ordered    ibuprofen (ADVIL,MOTRIN) 800 MG tablet  3 times daily with meals     04/17/18 1606           Shelbe Haglund, PA-C 04/17/18 1618    Gwyneth Sprout, MD 04/17/18 2320

## 2018-04-17 NOTE — Discharge Instructions (Signed)
Take ibuprofen 3 times a day with meals.  Do not take other anti-inflammatories at the same time (Advil, Motrin, naproxen, Aleve). You may supplement with Tylenol if you need further pain control. Use ice packs to help control your pain and swelling. Use aloe on your hand to help with pain. Return to the emergency room if you develop high fevers, increasing pain/redness, inability to move your fingers, or any new or concerning symptoms.

## 2018-04-17 NOTE — ED Triage Notes (Signed)
PT reports that he touched a hot pot at work. Minimal redness to end of right pointer and middle fingers.

## 2018-05-24 ENCOUNTER — Emergency Department (HOSPITAL_BASED_OUTPATIENT_CLINIC_OR_DEPARTMENT_OTHER)
Admission: EM | Admit: 2018-05-24 | Discharge: 2018-05-24 | Disposition: A | Discharge status: Home / Self Care | Payer: Self-pay | Attending: Emergency Medicine | Admitting: Emergency Medicine

## 2018-05-24 ENCOUNTER — Encounter (HOSPITAL_BASED_OUTPATIENT_CLINIC_OR_DEPARTMENT_OTHER): Payer: Self-pay

## 2018-05-24 DIAGNOSIS — Z5321 Procedure and treatment not carried out due to patient leaving prior to being seen by health care provider: Secondary | ICD-10-CM | POA: Insufficient documentation

## 2018-05-24 DIAGNOSIS — M79671 Pain in right foot: Secondary | ICD-10-CM | POA: Insufficient documentation

## 2018-05-24 NOTE — ED Triage Notes (Signed)
Pt c/o rt foot pain since yesterday, denies injury, hx of gout

## 2018-11-01 ENCOUNTER — Encounter (HOSPITAL_BASED_OUTPATIENT_CLINIC_OR_DEPARTMENT_OTHER): Payer: Self-pay

## 2018-11-01 ENCOUNTER — Emergency Department (HOSPITAL_BASED_OUTPATIENT_CLINIC_OR_DEPARTMENT_OTHER)
Admission: EM | Admit: 2018-11-01 | Discharge: 2018-11-01 | Disposition: A | Discharge status: Home / Self Care | Payer: Self-pay | Attending: Emergency Medicine | Admitting: Emergency Medicine

## 2018-11-01 ENCOUNTER — Other Ambulatory Visit: Payer: Self-pay

## 2018-11-01 DIAGNOSIS — J45909 Unspecified asthma, uncomplicated: Secondary | ICD-10-CM | POA: Insufficient documentation

## 2018-11-01 DIAGNOSIS — T783XXA Angioneurotic edema, initial encounter: Secondary | ICD-10-CM | POA: Insufficient documentation

## 2018-11-01 DIAGNOSIS — I10 Essential (primary) hypertension: Secondary | ICD-10-CM | POA: Insufficient documentation

## 2018-11-01 HISTORY — DX: Essential (primary) hypertension: I10

## 2018-11-01 MED ORDER — PREDNISONE 20 MG PO TABS
40.0000 mg | ORAL_TABLET | Freq: Once | ORAL | Status: AC
  Administered 2018-11-01: 40 mg via ORAL
  Filled 2018-11-01: qty 2

## 2018-11-01 MED ORDER — DIPHENHYDRAMINE HCL 25 MG PO CAPS
25.0000 mg | ORAL_CAPSULE | Freq: Once | ORAL | Status: AC
  Administered 2018-11-01: 25 mg via ORAL
  Filled 2018-11-01: qty 1

## 2018-11-01 MED ORDER — HYDROCHLOROTHIAZIDE 25 MG PO TABS: 25.0000 mg | ORAL_TABLET | Freq: Every day | ORAL | 0 refills | Status: AC

## 2018-11-01 NOTE — ED Provider Notes (Signed)
Emergency Department Provider Note   I have reviewed the triage vital signs and the nursing notes.   HISTORY  Chief Complaint Angioedema   HPI Brad Griffith is a 48 y.o. male with PMH of asthma and HTN on lisinopril presents to the emergency department for evaluation of lower lip swelling.  Patient took his lisinopril yesterday and had some mild swelling of the lower lip.  He states that decreased somewhat but took an additional dose today at noon.  He has had worsening swelling through the evening but it has not progressed beyond that.  He is not experiencing any swelling in the tongue, difficulty breathing, sensation of throat swelling or tightness.  No shortness of breath.  No rash.   Past Medical History:  Diagnosis Date  . Asthma   . Hypertension     There are no active problems to display for this patient.   History reviewed. No pertinent surgical history.  Allergies Lisinopril and Penicillins  No family history on file.  Social History Social History   Tobacco Use  . Smoking status: Never Smoker  . Smokeless tobacco: Never Used  Substance Use Topics  . Alcohol use: No  . Drug use: No    Review of Systems  Constitutional: No fever/chills Eyes: No visual changes. ENT: No sore throat. Positive lower lip swelling.  Cardiovascular: Denies chest pain.  Respiratory: Denies shortness of breath. Gastrointestinal: No abdominal pain.  Genitourinary: Negative for dysuria. Musculoskeletal: Negative for back pain. Skin: Negative for rash. Neurological: Negative for headaches, focal weakness or numbness.  10-point ROS otherwise negative.  ____________________________________________   PHYSICAL EXAM:  VITAL SIGNS: ED Triage Vitals [11/01/18 2059]  Enc Vitals Group     BP (!) 151/85     Pulse Rate (!) 103     Resp 16     Temp 98.3 F (36.8 C)     Temp Source Oral     SpO2 96 %     Weight (!) 427 lb (193.7 kg)     Height 6' (1.829 m)    Constitutional: Alert and oriented. Well appearing and in no acute distress. Eyes: Conjunctivae are normal.  Head: Atraumatic. Nose: No congestion/rhinnorhea. Mouth/Throat: Mucous membranes are moist. Large, swollen lower lip.  Neck: No stridor.  Cardiovascular: Tachycardia. Good peripheral circulation. Grossly normal heart sounds.   Respiratory: Normal respiratory effort.  No retractions. Lungs CTAB. Gastrointestinal: No distention.  Musculoskeletal: No lower extremity tenderness nor edema. No gross deformities of extremities. Neurologic:  Normal speech and language. No gross focal neurologic deficits are appreciated.  Skin:  Skin is warm, dry and intact. No rash noted.  ____________________________________________  RADIOLOGY  None ____________________________________________   PROCEDURES  Procedure(s) performed:   Procedures  None ____________________________________________   INITIAL IMPRESSION / ASSESSMENT AND PLAN / ED COURSE  Pertinent labs & imaging results that were available during my care of the patient were reviewed by me and considered in my medical decision making (see chart for details).  Patient presents the emergency department with isolated lower lip swelling.  He has no oropharyngeal swelling.  The oropharynx is widely patent.  He is speaking in a clear voice and managing secretions.  The presentation is most consistent with an angioedema.  He is on lisinopril.  I have listed this as an allergy and discussed that he can never take this medication again.  Plan to observe in the emergency department.  Will give Benadryl and steroid here along with close monitoring.   Patient monitored  in the ED for 2 hours. Lip swelling reduced. Symptoms seem to be resolving. Changed BP meds to HCTZ. Patient will discard Lisinopril meds at home. Discussed ED return precautions.  ____________________________________________  FINAL CLINICAL IMPRESSION(S) / ED DIAGNOSES  Final  diagnoses:  Angioedema, initial encounter     MEDICATIONS GIVEN DURING THIS VISIT:  Medications  diphenhydrAMINE (BENADRYL) capsule 25 mg (25 mg Oral Given 11/01/18 2115)  predniSONE (DELTASONE) tablet 40 mg (40 mg Oral Given 11/01/18 2115)     NEW OUTPATIENT MEDICATIONS STARTED DURING THIS VISIT:  Discharge Medication List as of 11/01/2018 11:16 PM    START taking these medications   Details  hydrochlorothiazide (HYDRODIURIL) 25 MG tablet Take 1 tablet (25 mg total) by mouth daily for 30 days., Starting Wed 11/01/2018, Until Fri 12/01/2018, Print        Note:  This document was prepared using Dragon voice recognition software and may include unintentional dictation errors.  Alona Bene, MD Emergency Medicine    Long, Arlyss Repress, MD 11/02/18 [phone]

## 2018-11-01 NOTE — ED Notes (Signed)
Slight decrease in lower lip swelling. Speech clear. Airway intact.

## 2018-11-01 NOTE — ED Triage Notes (Signed)
Pt just started taking lisinopril a few days ago, woke up early this morning with his bottom lip very swollen, it went down slightly, then he took another dose today and his lip started swelling again. Denies any throat swelling, no tongue swelling, no SOB.

## 2018-11-01 NOTE — Discharge Instructions (Signed)
He was seen today with lip swelling.  This is caused by your medication lisinopril.  You can never take this medication again.  I have listed as a severe allergy in her chart.  You should tell all medical providers about this allergy in the future.  I am starting you on a new medication which you can begin taking but need to discuss with your PCP regarding the dosage and continued use of this medicine.  You need to call 911 if you begin having tongue swelling, shortness of breath, throat tightness, or other concerning symptoms.

## 2019-02-08 ENCOUNTER — Encounter (HOSPITAL_BASED_OUTPATIENT_CLINIC_OR_DEPARTMENT_OTHER): Payer: Self-pay | Admitting: Emergency Medicine

## 2019-02-08 ENCOUNTER — Emergency Department (HOSPITAL_BASED_OUTPATIENT_CLINIC_OR_DEPARTMENT_OTHER)
Admission: EM | Admit: 2019-02-08 | Discharge: 2019-02-08 | Disposition: A | Discharge status: Home / Self Care | Payer: Self-pay | Attending: Emergency Medicine | Admitting: Emergency Medicine

## 2019-02-08 ENCOUNTER — Telehealth: Payer: Self-pay | Admitting: *Deleted

## 2019-02-08 ENCOUNTER — Emergency Department (HOSPITAL_BASED_OUTPATIENT_CLINIC_OR_DEPARTMENT_OTHER): Payer: Self-pay

## 2019-02-08 ENCOUNTER — Other Ambulatory Visit: Payer: Self-pay

## 2019-02-08 DIAGNOSIS — J45909 Unspecified asthma, uncomplicated: Secondary | ICD-10-CM | POA: Insufficient documentation

## 2019-02-08 DIAGNOSIS — I10 Essential (primary) hypertension: Secondary | ICD-10-CM | POA: Insufficient documentation

## 2019-02-08 DIAGNOSIS — S39012A Strain of muscle, fascia and tendon of lower back, initial encounter: Secondary | ICD-10-CM | POA: Insufficient documentation

## 2019-02-08 DIAGNOSIS — Y999 Unspecified external cause status: Secondary | ICD-10-CM | POA: Insufficient documentation

## 2019-02-08 DIAGNOSIS — Y9301 Activity, walking, marching and hiking: Secondary | ICD-10-CM | POA: Insufficient documentation

## 2019-02-08 DIAGNOSIS — Y929 Unspecified place or not applicable: Secondary | ICD-10-CM | POA: Insufficient documentation

## 2019-02-08 DIAGNOSIS — W01198A Fall on same level from slipping, tripping and stumbling with subsequent striking against other object, initial encounter: Secondary | ICD-10-CM | POA: Insufficient documentation

## 2019-02-08 MED ORDER — DICLOFENAC SODIUM 1 % TD GEL: 2.0000 g | Freq: Four times a day (QID) | TRANSDERMAL | 0 refills | Status: AC | PRN

## 2019-02-08 MED ORDER — METHOCARBAMOL 500 MG PO TABS: 500.0000 mg | ORAL_TABLET | Freq: Three times a day (TID) | ORAL | 0 refills | Status: AC | PRN

## 2019-02-08 MED ORDER — IBUPROFEN 800 MG PO TABS: 800.0000 mg | ORAL_TABLET | Freq: Three times a day (TID) | ORAL | 0 refills | Status: AC | PRN

## 2019-02-08 NOTE — ED Triage Notes (Signed)
Pt states that he was carrying in groceries yesterday and slipped landing on his lower back against some stairs at approx 1500. Pt denies any other symptoms or problems just pain. Denies LOC

## 2019-02-08 NOTE — ED Notes (Signed)
ED Provider at bedside. 

## 2019-02-08 NOTE — ED Provider Notes (Signed)
Emergency Department Provider Note   I have reviewed the triage vital signs and the nursing notes.   HISTORY  Chief Complaint Back Injury   HPI Brad Griffith is a 48 y.o. male with PMH of HTN, asthma, and elevated BMI presents to the emergency department for evaluation of lower back pain.  Patient slipped while carrying groceries yesterday at approximately 3 PM.  His lower back landed against some stairs.  He denies any head trauma or loss of consciousness.  He is not experiencing any pain radiating down to the legs.  No weakness or numbness in the legs.  His pain is in the lower back.  Worse with movement or touching in the area. No bladder incontinence.  No urinary retention symptoms.   Past Medical History:  Diagnosis Date  . Asthma   . Hypertension     There are no active problems to display for this patient.   History reviewed. No pertinent surgical history.  Allergies Lisinopril and Penicillins  No family history on file.  Social History Social History   Tobacco Use  . Smoking status: Never Smoker  . Smokeless tobacco: Never Used  Substance Use Topics  . Alcohol use: No  . Drug use: No    Review of Systems  Constitutional: No fever/chills Eyes: No visual changes. ENT: No sore throat. Cardiovascular: Denies chest pain. Respiratory: Denies shortness of breath. Gastrointestinal: No abdominal pain.  No nausea, no vomiting.  No diarrhea.  No constipation. Genitourinary: Negative for dysuria. Musculoskeletal: Positive for back pain. Skin: Negative for rash. Neurological: Negative for headaches, focal weakness or numbness.  10-point ROS otherwise negative.  ____________________________________________   PHYSICAL EXAM:  VITAL SIGNS: ED Triage Vitals  Enc Vitals Group     BP 02/08/19 0615 133/81     Pulse Rate 02/08/19 0613 100     Resp 02/08/19 0613 20     Temp 02/08/19 0613 98.6 F (37 C)     Temp Source 02/08/19 0613 Oral     SpO2 02/08/19  0613 98 %     Weight 02/08/19 0611 (!) 423 lb (191.9 kg)     Height 02/08/19 0611 6' (1.829 m)   Constitutional: Alert and oriented. Well appearing and in no acute distress. Eyes: Conjunctivae are normal.  Head: Atraumatic. Nose: No congestion/rhinnorhea. Mouth/Throat: Mucous membranes are moist.  Neck: No stridor.  Cardiovascular: Normal rate, regular rhythm. Good peripheral circulation. Grossly normal heart sounds.   Respiratory: Normal respiratory effort.  No retractions. Lungs CTAB. Gastrointestinal: Soft and nontender. No distention.  Musculoskeletal: No lower extremity tenderness nor edema. No gross deformities of extremities. Mild midline and paraspinal tenderness in the lumbar region. No sacrum/coccyx tenderness.  Neurologic:  Normal speech and language. No gross focal neurologic deficits are appreciated. Normal strength and sensation in the lower extremities.  Skin:  Skin is warm, dry and intact. No rash noted.  ____________________________________________  RADIOLOGY  Dg Lumbar Spine Complete  Result Date: 02/08/2019 CLINICAL DATA:  Fall with pain EXAM: LUMBAR SPINE - COMPLETE 4+ VIEW COMPARISON:  None available FINDINGS: Age-indeterminate posterior displacement of the coccyx. This could be chronic/developmental or traumatic. No evidence of lumbar spine fracture. IMPRESSION: 1. Negative lumbar spine imaging. 2. Sacrococcygeal offset of indeterminate age-please correlate for focal tenderness. Electronically Signed   By: Marnee SpringJonathon  Watts M.D.   On: 02/08/2019 06:42    ____________________________________________   PROCEDURES  Procedure(s) performed:   Procedures  None  ____________________________________________   INITIAL IMPRESSION / ASSESSMENT AND PLAN / ED  COURSE  Pertinent labs & imaging results that were available during my care of the patient were reviewed by me and considered in my medical decision making (see chart for details).   Patient presents to the  emergency department for evaluation of lower back pain after fall yesterday.  He has both midline and paraspinal tenderness.  X-ray ordered prior to my evaluation.  This was reviewed which shows sacrococcygeal offset.  Patient is not having pain in this region or focal tenderness on exam.  Suspect more chronic finding.  Patient without red flag signs or symptoms to suspect spinal cord injury.  No indication for emergency MRI.  Plan for conservative management at home.  Discussed ED return precautions.  Discussed that Robaxin may cause drowsiness and he should not drive a car while taking this medication.  Patient verbalizes understanding.   ____________________________________________  FINAL CLINICAL IMPRESSION(S) / ED DIAGNOSES  Final diagnoses:  Strain of lumbar region, initial encounter    NEW OUTPATIENT MEDICATIONS STARTED DURING THIS VISIT:  New Prescriptions   DICLOFENAC SODIUM (VOLTAREN) 1 % GEL    Apply 2 g topically 4 (four) times daily as needed.   IBUPROFEN (ADVIL) 800 MG TABLET    Take 1 tablet (800 mg total) by mouth every 8 (eight) hours as needed for moderate pain.   METHOCARBAMOL (ROBAXIN) 500 MG TABLET    Take 1 tablet (500 mg total) by mouth every 8 (eight) hours as needed.    Note:  This document was prepared using Dragon voice recognition software and may include unintentional dictation errors.  Nanda Quinton, MD Emergency Medicine    Carrie Schoonmaker, [facility], MD 02/08/19 [phone]

## 2019-02-08 NOTE — Discharge Instructions (Signed)
You have been seen in the Emergency Department (ED)  today for back pain.  Your workup and exam have not shown any acute abnormalities and you are likely suffering from muscle strain or possible problems with your discs, but there is no treatment that will fix your symptoms at this time.  Please take Motrin (ibuprofen) as needed for your pain according to the instructions written on the box.  Alternatively, for the next five days you can take 800mg three times daily with meals (it may upset your stomach).   Please follow up with your doctor as soon as possible regarding today's ED visit and your back pain.  Return to the ED for worsening back pain, fever, weakness or numbness of either leg, or if you develop either (1) an inability to urinate or have bowel movements, or (2) loss of your ability to control your bathroom functions (if you start having "accidents"), or if you develop other new symptoms that concern you.  

## 2019-02-08 NOTE — ED Triage Notes (Signed)
Pt has just gotten off work and has not tried any medications

## 2019-02-08 NOTE — ED Notes (Signed)
Pt understood dc material. NAD noted. Scripts sent electronically. All questions answered to satisfaction. Pt escorted to check out counter. 

## 2019-02-08 NOTE — Telephone Encounter (Signed)
Received phone message from pt stating he needed meds to go to [facility] instead of [facility]. When I called pt back states he was able to get his meds. Explained to pt he will need follow up with PCP. Pt states he has a PCP. He will look and give TOC CM a call back. States his PCP monitors his BP meds.  Jonnie Finner RN CCM Case Mgmt phone [phone]

## 2019-02-08 NOTE — ED Notes (Signed)
Patient transported to X-ray 

## 2021-03-12 IMAGING — CR LUMBAR SPINE - COMPLETE 4+ VIEW
6 series · 6 of 6 positions shown · non-contrast
Comparison: None available

CLINICAL DATA: Fall with pain

EXAM:
LUMBAR SPINE - COMPLETE 4+ VIEW

[t l-spine a.p. *]
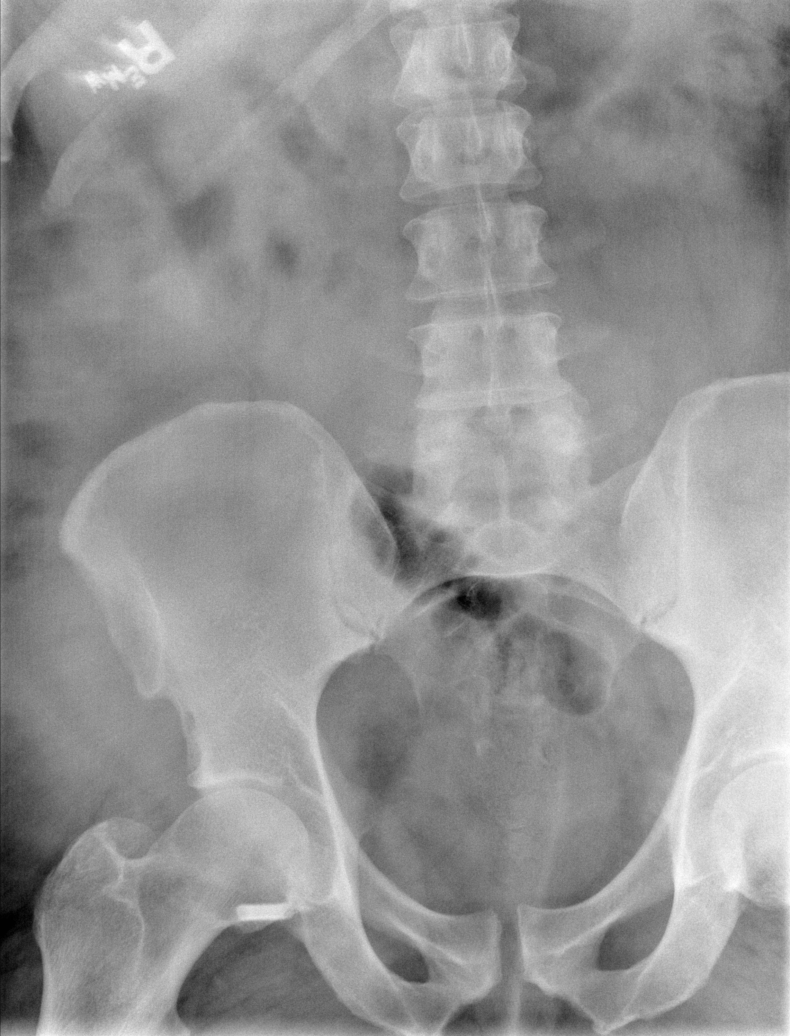

[t l-spine a.p.]
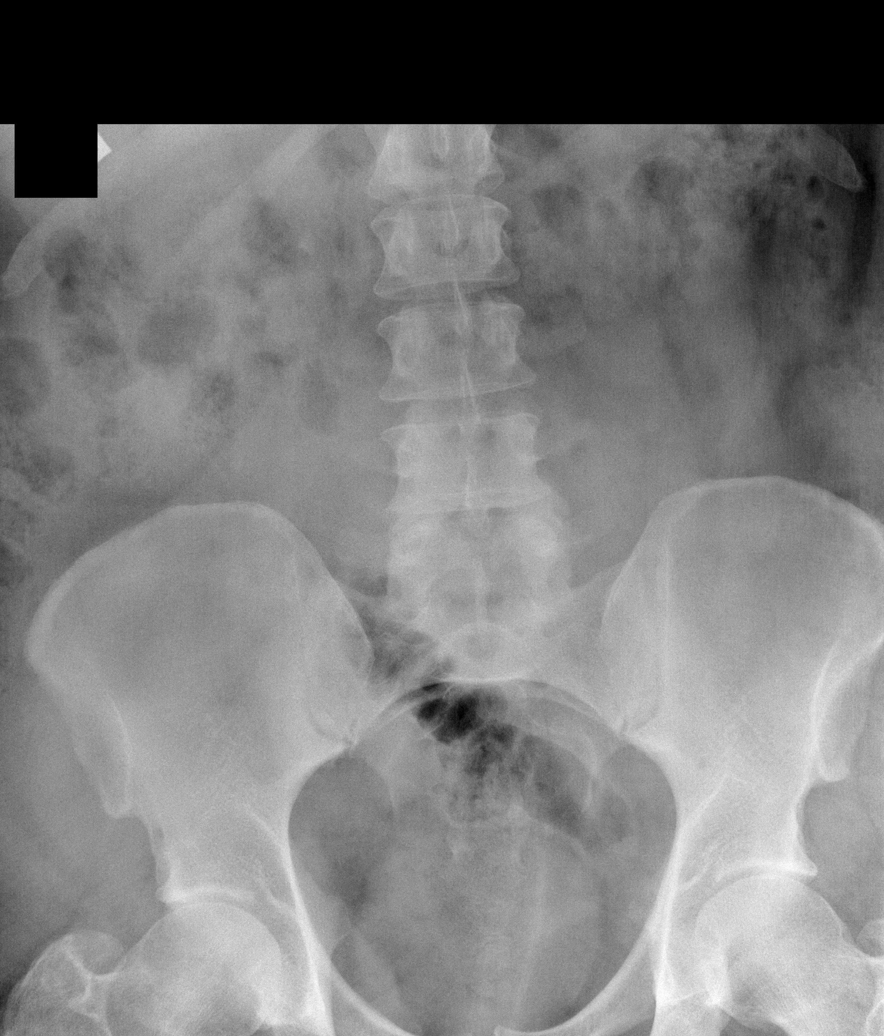

[t l-spine oblique exposure (1 of 2)]
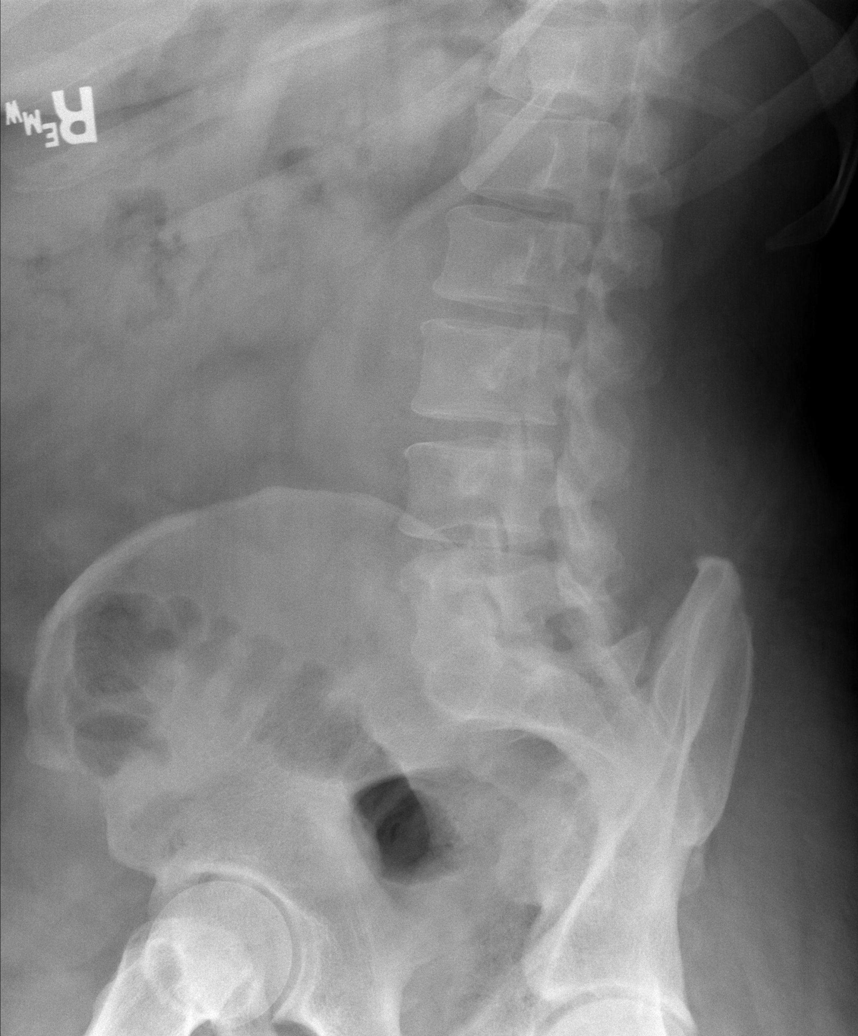

[t l-spine oblique exposure (2 of 2)]
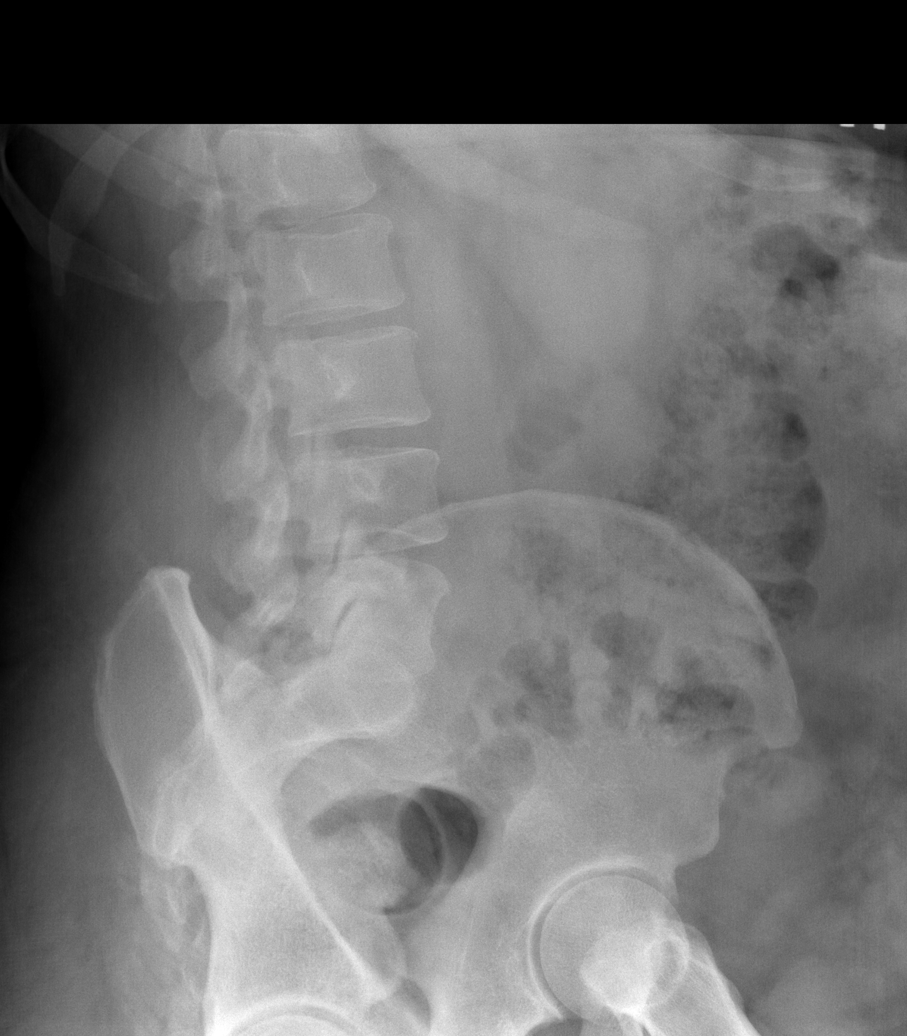

[t l-spine lat]
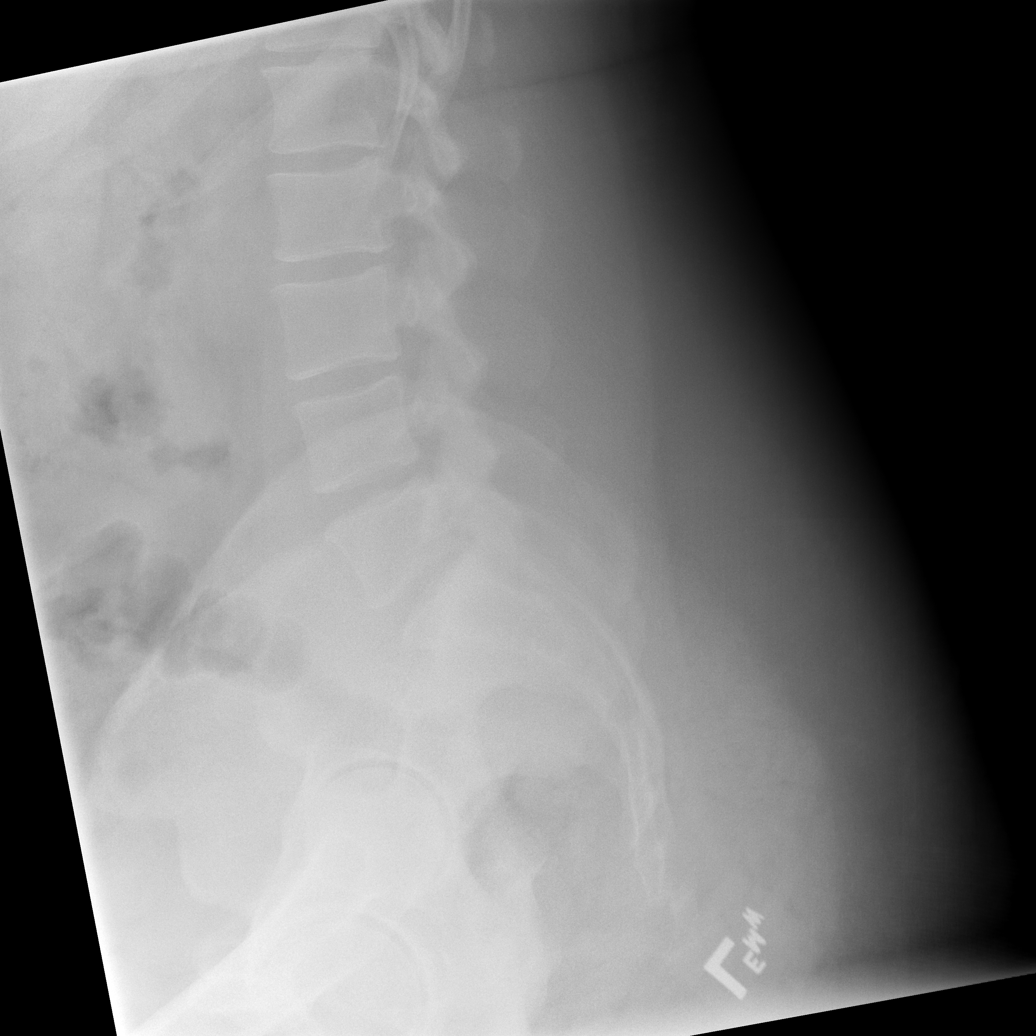

[t l-spine l5-s1 spot]
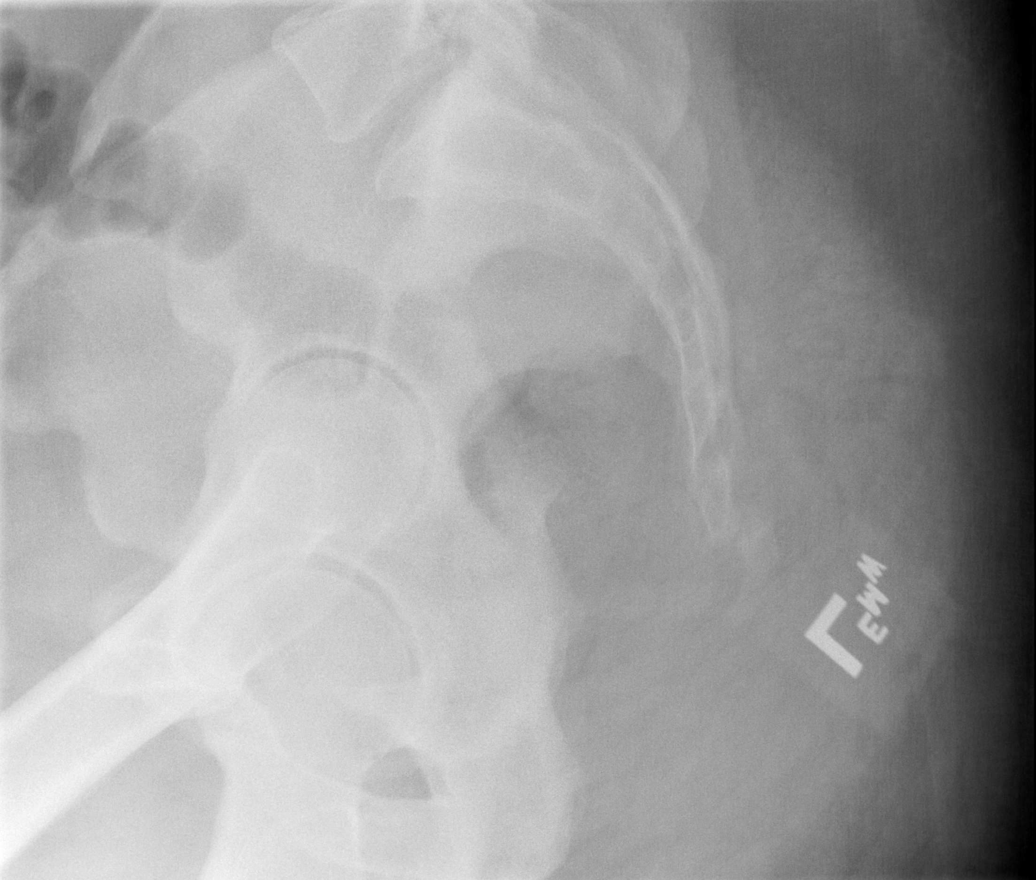

[6 of 6 positions shown; findings below may reference images not displayed]

FINDINGS: Age-indeterminate posterior displacement of the coccyx. This could
be chronic/developmental or traumatic. No evidence of lumbar spine
fracture.
IMPRESSION: 1. Negative lumbar spine imaging.
2. Sacrococcygeal offset of indeterminate age-please correlate for
focal tenderness.
